# Patient Record
Sex: Female | Born: 1995 | Race: Black or African American | Hispanic: No | Marital: Single | Smoking: Current every day smoker
Health system: Southern US, Community
[De-identification: ages and names within clinical notes are randomized; demographics above are authoritative.]

## PROBLEM LIST (undated history)

## (undated) DIAGNOSIS — F319 Bipolar disorder, unspecified: Secondary | ICD-10-CM

## (undated) DIAGNOSIS — F32A Depression, unspecified: Secondary | ICD-10-CM

## (undated) DIAGNOSIS — F329 Major depressive disorder, single episode, unspecified: Secondary | ICD-10-CM

---
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
## 2018-11-03 ENCOUNTER — Other Ambulatory Visit: Payer: Self-pay

## 2018-11-03 ENCOUNTER — Encounter (HOSPITAL_COMMUNITY): Payer: Self-pay

## 2018-11-03 ENCOUNTER — Emergency Department (HOSPITAL_COMMUNITY)
Admission: EM | Admit: 2018-11-03 | Discharge: 2018-11-03 | Disposition: A | Discharge status: Home / Self Care | Payer: Medicaid Other | Attending: Emergency Medicine | Admitting: Emergency Medicine

## 2018-11-03 ENCOUNTER — Emergency Department (HOSPITAL_COMMUNITY): Payer: Medicaid Other

## 2018-11-03 DIAGNOSIS — S161XXA Strain of muscle, fascia and tendon at neck level, initial encounter: Secondary | ICD-10-CM | POA: Insufficient documentation

## 2018-11-03 DIAGNOSIS — S199XXA Unspecified injury of neck, initial encounter: Secondary | ICD-10-CM | POA: Diagnosis present

## 2018-11-03 DIAGNOSIS — S0083XA Contusion of other part of head, initial encounter: Secondary | ICD-10-CM | POA: Diagnosis not present

## 2018-11-03 DIAGNOSIS — R51 Headache: Secondary | ICD-10-CM | POA: Diagnosis not present

## 2018-11-03 DIAGNOSIS — Y999 Unspecified external cause status: Secondary | ICD-10-CM | POA: Diagnosis not present

## 2018-11-03 DIAGNOSIS — Y9389 Activity, other specified: Secondary | ICD-10-CM | POA: Insufficient documentation

## 2018-11-03 DIAGNOSIS — F17210 Nicotine dependence, cigarettes, uncomplicated: Secondary | ICD-10-CM | POA: Insufficient documentation

## 2018-11-03 DIAGNOSIS — Y92410 Unspecified street and highway as the place of occurrence of the external cause: Secondary | ICD-10-CM | POA: Insufficient documentation

## 2018-11-03 DIAGNOSIS — S0990XA Unspecified injury of head, initial encounter: Secondary | ICD-10-CM

## 2018-11-03 HISTORY — DX: Depression, unspecified: F32.A

## 2018-11-03 HISTORY — DX: Bipolar disorder, unspecified: F31.9

## 2018-11-03 MED ORDER — CYCLOBENZAPRINE HCL 10 MG PO TABS: 10.0000 mg | ORAL_TABLET | Freq: Three times a day (TID) | ORAL | 0 refills | Status: AC | PRN

## 2018-11-03 MED ORDER — IBUPROFEN 800 MG PO TABS: 800.0000 mg | ORAL_TABLET | Freq: Four times a day (QID) | ORAL | 0 refills | Status: AC | PRN
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
## 2018-11-03 NOTE — ED Notes (Signed)
Legal blood draw completed
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
